# Patient Record
Sex: Male | Born: 1970 | Race: Asian | Hispanic: No | Marital: Married | Smoking: Former smoker
Health system: Southern US, Community
[De-identification: ages and names within clinical notes are randomized; demographics above are authoritative.]

## PROBLEM LIST (undated history)

## (undated) DIAGNOSIS — F411 Generalized anxiety disorder: Secondary | ICD-10-CM

## (undated) DIAGNOSIS — Z720 Tobacco use: Secondary | ICD-10-CM

## (undated) DIAGNOSIS — G44209 Tension-type headache, unspecified, not intractable: Secondary | ICD-10-CM

## (undated) HISTORY — DX: Tension-type headache, unspecified, not intractable: G44.209

## (undated) HISTORY — DX: Generalized anxiety disorder: F41.1

## (undated) HISTORY — DX: Tobacco use: Z72.0

---

## 2015-03-27 ENCOUNTER — Ambulatory Visit (INDEPENDENT_AMBULATORY_CARE_PROVIDER_SITE_OTHER): Payer: BLUE CROSS/BLUE SHIELD | Admitting: Osteopathic Medicine

## 2015-03-27 ENCOUNTER — Encounter: Payer: Self-pay | Admitting: Osteopathic Medicine

## 2015-03-27 ENCOUNTER — Other Ambulatory Visit: Payer: Self-pay | Admitting: Osteopathic Medicine

## 2015-03-27 VITALS — BP 107/69 | HR 71 | Ht 65.0 in | Wt 140.0 lb

## 2015-03-27 DIAGNOSIS — Z720 Tobacco use: Secondary | ICD-10-CM

## 2015-03-27 DIAGNOSIS — G44209 Tension-type headache, unspecified, not intractable: Secondary | ICD-10-CM | POA: Diagnosis not present

## 2015-03-27 DIAGNOSIS — F411 Generalized anxiety disorder: Secondary | ICD-10-CM | POA: Insufficient documentation

## 2015-03-27 DIAGNOSIS — E785 Hyperlipidemia, unspecified: Secondary | ICD-10-CM | POA: Diagnosis not present

## 2015-03-27 DIAGNOSIS — M9901 Segmental and somatic dysfunction of cervical region: Secondary | ICD-10-CM | POA: Diagnosis not present

## 2015-03-27 HISTORY — DX: Generalized anxiety disorder: F41.1

## 2015-03-27 HISTORY — DX: Tension-type headache, unspecified, not intractable: G44.209

## 2015-03-27 HISTORY — DX: Tobacco use: Z72.0

## 2015-03-27 MED ORDER — BUSPIRONE HCL 15 MG PO TABS
7.5000 mg | ORAL_TABLET | Freq: Two times a day (BID) | ORAL | Status: DC
Start: 2015-03-27 — End: 2015-04-10

## 2015-03-27 MED ORDER — PREGABALIN 50 MG PO CAPS: 50.0000 mg | ORAL_CAPSULE | Freq: Two times a day (BID) | ORAL | Status: DC

## 2015-03-27 NOTE — Progress Notes (Signed)
HPI: Joshua Bernard is a 45 y.o. male who [facility] [place] today for chief complaint of:  Chief Complaint  Patient presents with  . Establish Care    "general checkup, body aches (headaches), high anxiety"     SMOKING - on Chantix, has been on this for about 3-4 months. Was smoking a pack a day now 2 -3 cigarette.   HEADACHES/MUSCLE TENSION - Flexeril and Robaxin were used in the past. Interested in Lyrica.   DEPRESSION/ANXIETY - Paxil in the past  Generalized Anxiety D/O Evaluation:  Excessive anxiety/worry 6 mos+: Yes  Home and work/school: Yes  Difficulty to control: Yes  Associated symptoms (3/adult, 1/child):      Restlessness/on edge Yes      Fatigue Yes      Concentration Yes      Irritable Yes      Muscle tension Yes      Sleep disturbance Yes  Imparled social/occupational functioning: Yes  Any secondary cause or other concerns?   Major depression No concern  Personality d/o No concern  Social anxiety d/o No concern  Obsessive Compulsive d/o No concern  PTSD No concern  Eating d/o or Body Dysmorphic d/o No concern - stops eating when really stressed    Past medical, social and family history reviewed: Past Medical History  Diagnosis Date  . Generalized anxiety disorder 03/27/2015  . 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 Advised may require PA. Ergonomics in the workplace, generalized anxiety could certainly be contributing to these symptoms.   . Tobacco abuse 03/27/2015  . Generalized anxiety disorder 03/27/2015    Unable to tolerate SSRI, he Spiriva and initiated 03/27/2015, would consider Wellbutrin    History reviewed. No pertinent past surgical history. Social History  Substance Use Topics  . Smoking status: Not on file  . Smokeless tobacco: Not on file  . Alcohol Use: Not on file   No family history on file.  No  current outpatient prescriptions on file.   No current facility-administered medications for this visit.   No Known Allergies    Review of Systems: CONSTITUTIONAL:  No  fever, no chills, No  unintentional weight changes HEAD/EYES/EARS/NOSE/THROAT: (+) headache, no vision change, no hearing change, No  sore throat, No  sinus pressure CARDIAC: No  chest pain, No  pressure, No palpitations, No  orthopnea RESPIRATORY: No  cough, No  shortness of breath/wheeze GASTROINTESTINAL: No  nausea, No  vomiting, No  abdominal pain, No  blood in stool, No  diarrhea, No  constipation  MUSCULOSKELETAL: (+) myalgia/arthralgia GENITOURINARY: No  incontinence, No  abnormal genital bleeding/discharge SKIN: No  rash/wounds/concerning lesions HEM/ONC: No  easy bruising/bleeding, No  abnormal lymph node ENDOCRINE: No polyuria/polydipsia/polyphagia, No  heat/cold intolerance  NEUROLOGIC: No  weakness, No  dizziness, No  slurred speech PSYCHIATRIC: No  concerns with depression, (+) concerns with anxiety, No sleep problems  Exam:  BP 107/69 mmHg  Pulse 71  Ht 5\' 5"  (1.651 m)  Wt 140 lb (63.504 kg)  BMI 23.30 kg/m2 Constitutional: VS see above. General Appearance: alert, well-developed, well-nourished, NAD Eyes: Normal lids and conjunctive, non-icteric sclera,  Ears, Nose, Mouth, Throat: MMM, Normal external inspection ears/nares/mouth/lips/gums,  Neck: No masses, trachea midline. No thyroid enlargement/tenderness/mass appreciated. No lymphadenopathy Respiratory: Normal respiratory effort. no wheeze, no rhonchi, no rales Cardiovascular: S1/S2 normal, no murmur, no rub/gallop auscultated. RRR. No lower extremity edema. Gastrointestinal: Nontender,  no masses. Musculoskeletal: Gait normal. No clubbing/cyanosis of digits. (+) muscle tension/spasm b/l c-spine and upper thoracics, b/l restricted ROM, no midling tenderness Neurological: No cranial nerve deficit on limited exam. Motor and sensation intact and  symmetric Skin: warm, dry, intact. No rash/ulcer. No concerning nevi or subq nodules on limited exam.   Psychiatric: Normal judgment/insight. Hyper mood and affect. Oriented x3.     ASSESSMENT/PLAN: see patient instructions - can consider increase dose on Buspar and Lyrica if tolerates, consider Wellbutrin if needs daily medication since he didn't tolerate SSRI. Med switch or additions will require appt, unless trial other treatments in process of prior auth for Lyrica.   Generalized anxiety disorder - Plan: busPIRone (BUSPAR) 15 MG tablet  Muscle tension headache - Plan: pregabalin (LYRICA) 50 MG capsule  Somatic dysfunction of cervical region  Hyperlipidemia  Tobacco abuse   Procedure: OMT - MFR and ME to cervical spine/OA and upper thoracics to (+) patient relief  Return in about 1 year (around 03/26/2016), or sooner if needed, for [facility] VISIT AND MEDICATION MANAGEMENT.

## 2015-03-27 NOTE — Addendum Note (Signed)
Addended by: Deirdre PippinsALEXANDER, Janett Kamath M on: 03/27/2015 02:51 PM   Modules accepted: Level of Service

## 2015-03-27 NOTE — Patient Instructions (Addendum)
ANXIETY - we are starting an antianxiety medication called buspirone. Start at one half tablet 2 times daily, after 2-3 days can increase to 1 tablet 2 times daily. please let Dr. Lyn HollingsheadAlexander know if your symptoms are not controlled on the 1 tablet 2 times daily, because we can go up on the dose a bit further by you should talk with a physician before you do this on your own.  MUSCLE PAIN - pain and muscle tension/headaches should improve with treatment of anxiety, we have written for Lyrica prescription as requested, starting at a low-dose to see how this controls her symptoms, we can discuss increasing this medicine if you are tolerating the low dose and if your insurance covers it. Please be advised that your insurance may require that you try other medications before they will cover this medicine for you.   SMOKING - great job cutting back on the smoking! If you need refills on Chantix or if he like to talk more about adding the medication that we discussed at your visit, Wellbutrin, which helps with anxiety and can also help quitting smoking, we should talk about this more.   Anything else we can do for your, please call the office and let us know.  If you're doing well and just need medication refills, please have your pharmacy contact the office for refill requests. Please be sure to follow up at least annually for routine physical exam/wellness visit.    TAKE CARE! -DR. A.

## 2015-03-28 ENCOUNTER — Telehealth: Payer: Self-pay | Admitting: *Deleted

## 2015-03-28 DIAGNOSIS — F411 Generalized anxiety disorder: Secondary | ICD-10-CM

## 2015-03-28 NOTE — Telephone Encounter (Signed)
Pt's wife called and states the Buspar is not working for the patient and it made him very drowsy. She states he is still having very bad anxiety

## 2015-03-29 MED ORDER — PROPRANOLOL HCL 20 MG PO TABS: 10.0000 mg | ORAL_TABLET | Freq: Three times a day (TID) | ORAL | Status: DC | PRN

## 2015-03-29 NOTE — Telephone Encounter (Signed)
Patient has been informed and is fully aware to call the office and schedule appointment if the medication is not working also patient understands that he needs to bring a written list of all the medications he has taken in the past. Joshua Bernard,CMA.

## 2015-03-29 NOTE — Telephone Encounter (Signed)
Patient was advised at his visit that this medication, and os types of medication for anxiety, may cause some sedation effects. Sounds like he has been on hydroxyzine in the past which caused similar problems.   We'll trial propranolol, I have sent this in.   Patient states she did not want to be on daily therapy or controlled substances, however if this propranolol prescription is not helping him, he needs to come back for an appointment to talk more about daily medications as this may be his only option to avoid the sedating side effects that he does not tolereate. If he does need to come back in, he needs to bring a written list of all medications he has been on in the past for this issue.

## 2015-04-10 ENCOUNTER — Ambulatory Visit (INDEPENDENT_AMBULATORY_CARE_PROVIDER_SITE_OTHER): Payer: BLUE CROSS/BLUE SHIELD | Admitting: Osteopathic Medicine

## 2015-04-10 ENCOUNTER — Encounter: Payer: Self-pay | Admitting: Osteopathic Medicine

## 2015-04-10 VITALS — BP 110/77 | HR 73 | Ht 65.0 in | Wt 144.0 lb

## 2015-04-10 DIAGNOSIS — G44209 Tension-type headache, unspecified, not intractable: Secondary | ICD-10-CM | POA: Diagnosis not present

## 2015-04-10 DIAGNOSIS — F411 Generalized anxiety disorder: Secondary | ICD-10-CM | POA: Diagnosis not present

## 2015-04-10 MED ORDER — PREGABALIN 100 MG PO CAPS: 100.0000 mg | ORAL_CAPSULE | Freq: Two times a day (BID) | ORAL | Status: DC

## 2015-04-10 MED ORDER — BUPROPION HCL ER (XL) 150 MG PO TB24: 150.0000 mg | ORAL_TABLET | Freq: Every day | ORAL | Status: DC

## 2015-04-10 NOTE — Progress Notes (Signed)
HPI: Joshua Bernard is a 45 y.o. male who presents to [facility] today for chief complaint of:  Chief Complaint  Patient presents with  . Anxiety     Anxiety: Patient recently seen in the office and evaluated for anxiety, diagnosed with generalized anxiety disorder. Patient was declining daily SSRI therapy for prevention, requested something to use as needed. He had been on hydroxyzine in the past, so we tried buspirone. Patient reports side effects of drowsiness on this medication and is here to discuss further today.   Muscle tension headache: Patient doing well on Lyrica, his increased the dose after not feeling much effect of the 50 mg,   Past medical, social and family history reviewed: Past Medical History  Diagnosis Date  . Generalized anxiety disorder 03/27/2015  . 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 Advised may require PA. Ergonomics in the workplace, generalized anxiety could certainly be contributing to these symptoms.   . Tobacco abuse 03/27/2015  . Generalized anxiety disorder 03/27/2015    Unable to tolerate SSRI, he Spiriva and initiated 03/27/2015, would consider Wellbutrin    No past surgical history on file. Social History  Substance Use Topics  . Smoking status: Former Games developermoker  . Smokeless tobacco: Not on file  . Alcohol Use: Not on file   No family history on file.  Current Outpatient Prescriptions  Medication Sig Dispense Refill  . atorvastatin (LIPITOR) 80 MG tablet   5  . busPIRone (BUSPAR) 15 MG tablet Take 0.5 tablets (7.5 mg total) by mouth 2 (two) times daily. After 3 days, can increase to 1 tablet (15 mg total) 2 (two) times daily. 60 tablet 1  . CHANTIX 1 MG tablet Take 1 mg by mouth.  1  . pregabalin (LYRICA) 50 MG capsule Take 1 capsule (50 mg total) by mouth 2 (two) times daily. 30 capsule 0  . propranolol (INDERAL)  20 MG tablet Take 0.5-1 tablets (10-20 mg total) by mouth 3 (three) times daily as needed (anxiety). 30 tablet 1   No current facility-administered medications for this visit.   No Known Allergies    Review of Systems: CONSTITUTIONAL:  No  fever, no chills, No  unintentional weight changes CARDIAC: No  chest pain, No  pressure, No palpitations, No  orthopnea RESPIRATORY: No  cough, No  shortness of breath/wheeze NEUROLOGIC: No  weakness, No  dizziness, No  slurred speech PSYCHIATRIC: No  concerns with depression, (+) concerns with anxiety, No sleep problems  Exam:  BP 110/77 mmHg  Pulse 73  Ht 5\' 5"  (1.651 m)  Wt 144 lb (65.318 kg)  BMI 23.96 kg/m2 Constitutional: VS see above. General Appearance: alert, well-developed, well-nourished, NAD Psychiatric: Normal judgment/insight. Normal mood and affect. Oriented x3.    No results found for this or any previous visit (from the past 72 hour(s)).   GAD 7 : Generalized Anxiety Score 04/10/2015  Nervous, Anxious, on Edge 1  Control/stop worrying 1  Worry too much - different things 1  Trouble relaxing 1  Restless 0  Easily annoyed or irritable 1  Afraid - awful might happen 0  Total GAD 7 Score 5  Anxiety Difficulty Somewhat difficult       ASSESSMENT/PLAN: We'll trial will be changed given smoking history and concentration problems. The other SSRI may be more helpful for anxiety, would like to avoid side effects of possible erectile issues since he and  wife are trying to conceive. If doing well and minimal side effects but inadequate symptom control, could consider going up to the 300 mg tablets.  Generalized anxiety disorder - Plan: buPROPion (WELLBUTRIN XL) 150 MG 24 hr tablet, DISCONTINUED: buPROPion (WELLBUTRIN XL) 150 MG 24 hr tablet  Muscle tension headache - Plan: pregabalin (LYRICA) 100 MG capsule     Return if symptoms worsen or fail to improve.

## 2015-04-30 ENCOUNTER — Other Ambulatory Visit: Payer: Self-pay | Admitting: *Deleted

## 2015-04-30 DIAGNOSIS — G44209 Tension-type headache, unspecified, not intractable: Secondary | ICD-10-CM

## 2015-04-30 MED ORDER — PREGABALIN 100 MG PO CAPS: 100.0000 mg | ORAL_CAPSULE | Freq: Two times a day (BID) | ORAL | Status: DC

## 2015-04-30 NOTE — Progress Notes (Unsigned)
The patient's wife called and states they needed a full 30 day supply of Lyrica sent into the pharmacy due to insurance purposes. I think # 30 may have been sent in instead of # 60. I called pharm and changed this but just wanted to make sure I was not overlooking anything and that the rx was to be indeed written for # 60 with a sig of take one by mouth twice daily

## 2015-04-30 NOTE — Progress Notes (Signed)
That's fine.  Thank you.

## 2015-05-01 ENCOUNTER — Telehealth: Payer: Self-pay

## 2015-05-01 DIAGNOSIS — G44209 Tension-type headache, unspecified, not intractable: Secondary | ICD-10-CM

## 2015-05-01 MED ORDER — PREGABALIN 100 MG PO CAPS: 100.0000 mg | ORAL_CAPSULE | Freq: Two times a day (BID) | ORAL | Status: DC

## 2015-05-01 NOTE — Telephone Encounter (Signed)
Patient request a Rx for 30 day supply of Lyrica 100 mg. # 60 1 refill was faxed today to [facility] pharmacy by Estelle Junehonda Cunningham,CMA

## 2015-05-01 NOTE — Telephone Encounter (Signed)
Left VM for wife advising Rx was approved and faxed.

## 2015-05-12 ENCOUNTER — Other Ambulatory Visit: Payer: Self-pay | Admitting: Osteopathic Medicine

## 2015-05-28 ENCOUNTER — Other Ambulatory Visit: Payer: Self-pay | Admitting: Osteopathic Medicine

## 2015-05-28 ENCOUNTER — Telehealth: Payer: Self-pay

## 2015-05-28 MED ORDER — PREGABALIN 100 MG PO CAPS: 100.0000 mg | ORAL_CAPSULE | Freq: Two times a day (BID) | ORAL | Status: DC

## 2015-05-28 NOTE — Telephone Encounter (Signed)
Patient requested refill for Lyrica 100 mg. Izaiyah Kleinman,CMA

## 2015-06-10 ENCOUNTER — Other Ambulatory Visit: Payer: Self-pay | Admitting: Osteopathic Medicine

## 2015-06-13 ENCOUNTER — Other Ambulatory Visit: Payer: Self-pay | Admitting: Osteopathic Medicine

## 2015-06-15 ENCOUNTER — Other Ambulatory Visit: Payer: Self-pay | Admitting: Osteopathic Medicine

## 2015-06-16 ENCOUNTER — Other Ambulatory Visit: Payer: Self-pay | Admitting: Family Medicine

## 2015-06-16 ENCOUNTER — Other Ambulatory Visit: Payer: Self-pay | Admitting: Osteopathic Medicine

## 2015-06-18 ENCOUNTER — Other Ambulatory Visit: Payer: Self-pay | Admitting: Osteopathic Medicine

## 2015-06-20 ENCOUNTER — Other Ambulatory Visit: Payer: Self-pay

## 2015-06-20 MED ORDER — PREGABALIN 100 MG PO CAPS: 100.0000 mg | ORAL_CAPSULE | Freq: Two times a day (BID) | ORAL | Status: DC

## 2015-07-24 ENCOUNTER — Other Ambulatory Visit: Payer: Self-pay | Admitting: Osteopathic Medicine

## 2015-07-27 ENCOUNTER — Encounter: Payer: Self-pay | Admitting: Osteopathic Medicine

## 2015-07-27 ENCOUNTER — Ambulatory Visit (INDEPENDENT_AMBULATORY_CARE_PROVIDER_SITE_OTHER): Payer: BLUE CROSS/BLUE SHIELD | Admitting: Osteopathic Medicine

## 2015-07-27 ENCOUNTER — Other Ambulatory Visit: Payer: Self-pay | Admitting: Osteopathic Medicine

## 2015-07-27 VITALS — BP 122/82 | HR 73 | Ht 65.0 in | Wt 145.0 lb

## 2015-07-27 DIAGNOSIS — L709 Acne, unspecified: Secondary | ICD-10-CM

## 2015-07-27 DIAGNOSIS — G44209 Tension-type headache, unspecified, not intractable: Secondary | ICD-10-CM

## 2015-07-27 DIAGNOSIS — R5383 Other fatigue: Secondary | ICD-10-CM

## 2015-07-27 DIAGNOSIS — E785 Hyperlipidemia, unspecified: Secondary | ICD-10-CM | POA: Diagnosis not present

## 2015-07-27 DIAGNOSIS — F411 Generalized anxiety disorder: Secondary | ICD-10-CM | POA: Diagnosis not present

## 2015-07-27 MED ORDER — AMITRIPTYLINE HCL 25 MG PO TABS: 25.0000 mg | ORAL_TABLET | Freq: Every day | ORAL | Status: DC

## 2015-07-27 MED ORDER — PREGABALIN 150 MG PO CAPS: 150.0000 mg | ORAL_CAPSULE | Freq: Two times a day (BID) | ORAL | Status: DC

## 2015-07-27 MED ORDER — TRETINOIN 0.1 % EX CREA: TOPICAL_CREAM | Freq: Every day | CUTANEOUS | Status: AC

## 2015-07-27 NOTE — Patient Instructions (Addendum)
Try tapering off the Wellbutrin, we will try Amitryptilene at night for anxiety and sleep / muscle tension problems.   Take Lyrica twice per day as directed on the prescription bottle.   If no better let me know and we will send referral to psychiatry for anxiety treatment.   Plan to get blood work done today - we will call you next week with results.

## 2015-07-27 NOTE — Progress Notes (Signed)
HPI: Joshua Bernard is a 45 y.o. Not Hispanic or Latino male  who [facility] today, 07/27/2015,  for chief complaint of:  Chief Complaint  Patient presents with  . Follow-up    anxiety     Anxiety: Doing a little bit worse in this regard. Patient thinks that the Wellbutrin has started to give him headaches, though it has helped with smoking cessation. The spirometry doesn't seem to be helping as much as it did. Patient requests switching medication to something else. Does not want to be on SSRI therapy due to concern for previous side effects. See below for brief review of records. Patient is experiencing some fatigue concerns and some difficulty sleeping on occasion.  Muscle tension: Patient has been taking the Lyrica 2 tablets in the morning rather than 1 tablet twice a day as directed, he notes that he is waking up with some muscle stiffness, he initially started taking 2 tablets daily because the twice a day dosing wasn't really helping him. Requests a higher dose of this medication because that the dose does seem to be working little bit better throughout the day. Patient was not understanding that the medication is 12 hour duration of effect and this is why it twice a day dosing.  Acne: Patient has been using wife's Retin-A, has been helping him greatly, requests his own prescription.  Cholesterol, other labs: We never got records from previous PCP, will go ahead and get blood work done today.   Records reviewed: Patient was last seen in the office 04/10/2015, at that time we initiated Wellbutrin for anxiety, given side effects from anxiolytics buspirone which was initially started on 03/27/2015. She had some concern for SSRI side effects - had been on SSRI in the past and experienced erectile/libido issues.. Also using Lyrica for muscle tension    Past medical, surgical, social and family history reviewed: Past Medical History   Diagnosis Date  . Generalized anxiety disorder 03/27/2015  . 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 Advised may require PA. Ergonomics in the workplace, generalized anxiety could certainly be contributing to these symptoms.   . Tobacco abuse 03/27/2015  . Generalized anxiety disorder 03/27/2015    Unable to tolerate SSRI, he Spiriva and initiated 03/27/2015, would consider Wellbutrin    No past surgical history on file. Social History  Substance Use Topics  . Smoking status: Former Games developermoker  . Smokeless tobacco: Not on file  . Alcohol Use: Not on file   No family history on file.   Current medication list and allergy/intolerance information reviewed:   Current Outpatient Prescriptions  Medication Sig Dispense Refill  . atorvastatin (LIPITOR) 80 MG tablet   5  . buPROPion (WELLBUTRIN XL) 150 MG 24 hr tablet TAKE 1 TABLET(150 MG) BY MOUTH DAILY 90 tablet 0  . buPROPion (WELLBUTRIN XL) 150 MG 24 hr tablet TAKE 1 TABLET(150 MG) BY MOUTH DAILY 90 tablet 0  . LYRICA 100 MG capsule TAKE ONE CAPSULE BY MOUTH TWICE DAILY 60 capsule 0   No current facility-administered medications for this visit.   No Known Allergies    Review of Systems:  Constitutional:  No  fever, no chills, No recent illness, No unintentional weight changes. (+) significant fatigue.   HEENT: (+) headache, no vision change, no hearing change, No sore throat, No  sinus pressure  Cardiac: No  chest pain, No  pressure  Respiratory:  No  shortness of breath.   Musculoskeletal: No new myalgia/arthralgia  Skin: (+) Acne as per history of present illness No  Rash, No other wounds/concerning lesions  Psychiatric: No  concerns with depression, (+) concerns with anxiety, (+) sleep problems, No mood problems  Exam:  BP 122/82 mmHg  Pulse 73  Ht  (1.651 m)  Wt 145 lb (65.772 kg)  BMI 24.13 kg/m2  Constitutional: VS  see above. General Appearance: alert, well-developed, well-nourished, NAD  Ears, Nose, Mouth, Throat: MMM, Normal external inspection ears/nares/mouth/lips/gums  Neck: No masses, trachea midline.   Respiratory: Normal respiratory effort.  Neurological: Normal balance/coordination. No tremor.   Skin: warm, dry, intact. No rash/ulcer. No concerning nevi or subq nodules on limited exam.    Psychiatric: Fair to normal judgment/insight. Normal mood and affect. Oriented x3. GAD7 score increased.   GAD 7 : Generalized Anxiety Score 07/27/2015 04/10/2015  Nervous, Anxious, on Edge 2 1  Control/stop worrying 3 1  Worry too much - different things 3 1  Trouble relaxing 1 1  Restless 0 0  Easily annoyed or irritable 0 1  Afraid - awful might happen 0 0  Total GAD 7 Score 9 5  Anxiety Difficulty Somewhat difficult Somewhat difficult       ASSESSMENT/PLAN:   Given persistent muscle aches complaints, worse in neck area, consideration for possible fibromyalgia syndrome with comorbid generalized anxiety disorder. We'll update labs as well to evaluate for secondary cause since we never got previous records.  Medication adjustment as follows: Taper down on Wellbutrin, the patient states that he would rather just stop this altogether, I advised this can cause withdrawal symptoms but it is certainly an option. Initiate amitriptyline at low dose, may not be particularly helpful for generalized anxiety but hopefully will get some benefit in terms of chronic muscle pain. Slight increased dose on Lyrica and patient encouraged to use twice a day dosing as directed on the perception bottle.  If no improvement in symptoms, consider psychiatry referral for assistance with medication management since patient adamantly declines SSRI therapy, has noted some problems with taking Wellbutrin, buspirone has been somewhat effective but overall symptoms have persisted. Would also recommend counseling  Generalized  anxiety disorder - Patient would like to get off OB chin, declines SSRI. Given muscle tension issues, will trial Elavil, continue BuSpar. Refer to psych if no improvement - Plan: TSH, amitriptyline (ELAVIL) 25 MG tablet  Hyperlipidemia - Plan: Lipid panel  Other fatigue - Labs as below. Diet and exercise modifications encouraged - Plan: CBC with Differential/Platelet, COMPLETE METABOLIC PANEL WITH GFR, VITAMIN D 25 Hydroxy (Vit-D Deficiency, Fractures)  Muscle tension headache - Tension headache, neck spasm frequently. Lyrica has been helping a bit. Consider fibromyalgia diagnosis - Plan: pregabalin (LYRICA) 150 MG capsule, amitriptyline (ELAVIL) 25 MG tablet  Acne, unspecified acne type - Plan: tretinoin (RETIN-A) 0.1 % cream     Visit summary with medication list and pertinent instructions was printed for patient to review. All questions at time of visit were answered - patient instructed to contact office with any additional concerns. ER/RTC precautions were reviewed with the patient. Follow-up plan: Return in about 3 months (around 10/27/2015), or sooner if needed / if problems on labs, for MEDICATION MANAGEMENT .

## 2015-07-28 LAB — COMPLETE METABOLIC PANEL WITH GFR
ALBUMIN: 4.6 g/dL (ref 3.6–5.1)
ALK PHOS: 64 U/L (ref 40–115)
ALT: 24 U/L (ref 9–46)
AST: 22 U/L (ref 10–40)
BUN: 15 mg/dL (ref 7–25)
CALCIUM: 9.2 mg/dL (ref 8.6–10.3)
CHLORIDE: 106 mmol/L (ref 98–110)
CO2: 25 mmol/L (ref 20–31)
CREATININE: 1.03 mg/dL (ref 0.60–1.35)
GFR, Est Non African American: 89 mL/min (ref >=60)
Glucose, Bld: 85 mg/dL (ref 65–99)
Potassium: 4.1 mmol/L (ref 3.5–5.3)
Sodium: 140 mmol/L (ref 135–146)
Total Bilirubin: 0.7 mg/dL (ref 0.2–1.2)
Total Protein: 6.6 g/dL (ref 6.1–8.1)

## 2015-07-28 LAB — CBC WITH DIFFERENTIAL/PLATELET
BASOS ABS: 88 {cells}/uL (ref 0–200)
Basophils Relative: 1 %
EOS ABS: 264 {cells}/uL (ref 15–500)
EOS PCT: 3 %
HCT: 41.1 % (ref 38.5–50.0)
Hemoglobin: 13.9 g/dL (ref 13.2–17.1)
LYMPHS PCT: 37 %
Lymphs Abs: 3256 cells/uL (ref 850–3900)
MCH: 29.4 pg (ref 27.0–33.0)
MCHC: 33.8 g/dL (ref 32.0–36.0)
MCV: 86.9 fL (ref 80.0–100.0)
MONOS PCT: 8 %
MPV: 11.4 fL (ref 7.5–12.5)
Monocytes Absolute: 704 cells/uL (ref 200–950)
NEUTROS PCT: 51 %
Neutro Abs: 4488 cells/uL (ref 1500–7800)
PLATELETS: 205 10*3/uL (ref 140–400)
RBC: 4.73 MIL/uL (ref 4.20–5.80)
RDW: 13.7 % (ref 11.0–15.0)
WBC: 8.8 10*3/uL (ref 3.8–10.8)

## 2015-07-28 LAB — VITAMIN D 25 HYDROXY (VIT D DEFICIENCY, FRACTURES): VIT D 25 HYDROXY: 26 ng/mL — AB (ref 30–100)

## 2015-07-28 LAB — LIPID PANEL
CHOLESTEROL: 157 mg/dL (ref 125–200)
HDL: 39 mg/dL — ABNORMAL LOW (ref >=40)
LDL Cholesterol: 94 mg/dL (ref <130)
Total CHOL/HDL Ratio: 4 Ratio (ref <=5.0)
Triglycerides: 120 mg/dL (ref <150)
VLDL: 24 mg/dL (ref <30)

## 2015-07-28 LAB — TSH: TSH: 0.83 m[IU]/L (ref 0.40–4.50)

## 2015-08-02 ENCOUNTER — Other Ambulatory Visit: Payer: Self-pay

## 2015-08-02 DIAGNOSIS — E785 Hyperlipidemia, unspecified: Secondary | ICD-10-CM

## 2015-08-02 MED ORDER — ATORVASTATIN CALCIUM 80 MG PO TABS: 80.0000 mg | ORAL_TABLET | Freq: Every day | ORAL | Status: DC

## 2015-08-03 ENCOUNTER — Emergency Department (INDEPENDENT_AMBULATORY_CARE_PROVIDER_SITE_OTHER): Payer: BLUE CROSS/BLUE SHIELD

## 2015-08-03 ENCOUNTER — Emergency Department (INDEPENDENT_AMBULATORY_CARE_PROVIDER_SITE_OTHER)
Admission: EM | Admit: 2015-08-03 | Discharge: 2015-08-03 | Disposition: A | Discharge status: Home / Self Care | Payer: BLUE CROSS/BLUE SHIELD | Source: Home / Self Care | Attending: Family Medicine | Admitting: Family Medicine

## 2015-08-03 DIAGNOSIS — L03011 Cellulitis of right finger: Secondary | ICD-10-CM

## 2015-08-03 DIAGNOSIS — S61219A Laceration without foreign body of unspecified finger without damage to nail, initial encounter: Secondary | ICD-10-CM

## 2015-08-03 DIAGNOSIS — S61412A Laceration without foreign body of left hand, initial encounter: Secondary | ICD-10-CM | POA: Diagnosis not present

## 2015-08-03 DIAGNOSIS — M7989 Other specified soft tissue disorders: Secondary | ICD-10-CM

## 2015-08-03 DIAGNOSIS — M795 Residual foreign body in soft tissue: Secondary | ICD-10-CM

## 2015-08-03 MED ORDER — MUPIROCIN 2 % EX OINT: 1.0000 "application " | TOPICAL_OINTMENT | Freq: Three times a day (TID) | CUTANEOUS | Status: AC

## 2015-08-03 MED ORDER — CEPHALEXIN 500 MG PO CAPS: 500.0000 mg | ORAL_CAPSULE | Freq: Three times a day (TID) | ORAL | Status: DC

## 2015-08-03 NOTE — ED Notes (Signed)
Pt was cutting an onion on Monday, and pulled the band aid off today, and feels that it is not healing.  Skin is separated, but no signs of infection.

## 2015-08-03 NOTE — ED Provider Notes (Signed)
CSN: [id number]     Arrival date & time 08/03/15  1259 History   First MD Initiated Contact with Patient 08/03/15 1324     Chief Complaint  Patient presents with  . Extremity Laceration   (Consider location/radiation/quality/duration/timing/severity/associated sxs/prior Treatment)  Patient was cutting an onion with a knife 3 days ago, and lacerated the tip of his left second finger.  He pulled the band-aid off today, and feels that it is not healing.  He also believes that he may have a small retained foreign body in the volar surface of his right third finger. His Tdap is current.    The history is provided by the patient.  Laceration  Location:  Finger Finger laceration location:  L thumb and L index finger Length:  1 cm Depth:  Through dermis Quality: avulsion   Bleeding: controlled   Time since incident:  3 days Laceration mechanism:  Knife Pain details:    Quality:  Aching   Severity:  Mild   Timing:  Constant   Progression:  Improving Foreign body present:  Unable to specify Worsened by:  Movement Ineffective treatments: bandage. Tetanus status:  Up to date Associated symptoms: no numbness, no redness and no swelling     Past Medical History  Diagnosis Date  . Generalized anxiety disorder 03/27/2015  . 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 Advised may require PA. Ergonomics in the workplace, generalized anxiety could certainly be contributing to these symptoms.   . Tobacco abuse 03/27/2015  . Generalized anxiety disorder 03/27/2015    Unable to tolerate SSRI, he Spiriva and initiated 03/27/2015, would consider Wellbutrin    History reviewed. No pertinent past surgical history. History reviewed. No pertinent family history. Social History  Substance Use Topics  . Smoking status: Former Games developer  . Smokeless tobacco: None  . Alcohol Use: None    Review of Systems    All other systems reviewed and are negative.   Allergies  Review of patient's allergies indicates no known allergies.  Home Medications   Prior to Admission medications   Medication Sig Start Date End Date Taking? Authorizing Provider  amitriptyline (ELAVIL) 25 MG tablet Take 1 tablet (25 mg total) by mouth at bedtime. Can increase to 2 tablets per night after 2 weeks. 07/27/15   Sunnie Nielsen, DO  atorvastatin (LIPITOR) 80 MG tablet Take 1 tablet (80 mg total) by mouth daily at 6 PM. 08/02/15   Sunnie Nielsen, DO  buPROPion (WELLBUTRIN XL) 150 MG 24 hr tablet TAKE 1 TABLET(150 MG) BY MOUTH DAILY 06/18/15   Sunnie Nielsen, DO  cephALEXin (KEFLEX) 500 MG capsule Take 1 capsule (500 mg total) by mouth 3 (three) times daily. 08/03/15   Lattie Haw, MD  mupirocin ointment (BACTROBAN) 2 % Apply 1 application topically 3 (three) times daily. 08/03/15   Lattie Haw, MD  pregabalin (LYRICA) 150 MG capsule Take 1 capsule (150 mg total) by mouth 2 (two) times daily. 07/27/15   Sunnie Nielsen, DO  tretinoin (RETIN-A) 0.1 % cream Apply topically at bedtime. 07/27/15   Sunnie Nielsen, DO   Meds Ordered and Administered this Visit  Medications - No data to display  BP 115/77 mmHg  Pulse 77  Temp(Src) 98.8 F (37.1 C) (Oral)  Ht  (1.651 m)  Wt 143 lb 8 oz (65.091 kg)  BMI 23.88 kg/m2  SpO2 99% No data found.   Physical Exam  Constitutional:  He is oriented to person, place, and time. He appears well-developed and well-nourished. No distress.  HENT:  Head: Atraumatic.  Eyes: Pupils are equal, round, and reactive to light.  Musculoskeletal:       Hands: Patient's left second finger, distal phalanx, radial aspect has a superficial 1cm flap laceration that appears slightly erythematous and mildly tender to palpation.  No drainage present.  Fingertip has full range of motion.   His right third finger DIP joint, volar surface, has hyperkeratosis that could contain a small  foreign body; the area is slightly erythematous and tender to palpation   The DIP joint has full range of motion.  Neurological: He is alert and oriented to person, place, and time.  Skin: Skin is warm and dry.  Nursing note and vitals reviewed.   ED Course  Procedures Laceration Repair (Dermabond) left second finger distal phalanx. Discussed benefits and risks of procedure and verbal consent obtained. Using sterile technique, cleansed wound with Betadine followed by copious lavage with normal saline.  Wound carefully inspected for debris and foreign bodies; none found.  Wound edges carefully approximated in normal anatomic position and closed with Dermabond.  Wound precautions explained to patient.    Right third finger volar surface:  Cleansed DIP joint with Betadine and normal saline.  Without anesthesia, carefully debrided hyperkeratosis superficially.  No foreign body present.  Applied Bacitracin plus bandage.    Imaging Review Dg Finger Middle Right  08/03/2015  CLINICAL DATA:  Right middle finger laceration, evaluate for foreign body EXAM: RIGHT MIDDLE FINGER 2+V COMPARISON:  None available FINDINGS: Mild soft tissue swelling. No acute osseous finding or fracture. Normal alignment. No joint abnormality. No visualized radiopaque foreign body. IMPRESSION: Soft tissue swelling.  No acute osseous finding Electronically Signed   By: Judie PetitM.  Shick M.D.   On: 08/03/2015 14:16     Visual Acuity Review  Right Eye Distance:   Left Eye Distance:   Bilateral Distance:    Right Eye Near:   Left Eye Near:    Bilateral Near:           MDM   1. Laceration of finger of left hand with complication, initial encounter.    2. Examination for foreign body (FB) in soft tissue of middle finger, right  3. Cellulitis of middle finger, right    Sutures not indicated for small flap laceration left second finger.  However, would benefit from closure with Dermabond.  Suspect mild early cellulitis  present. No evidence of foreign body at DIP joint of right third finger. Applied stack splint to left second fingertip Begin Keflex 500mg  TID.  Apply Bactroban to right 3rd finger wound until healed. Change bandage on right middle finger daily until healed.  Follow instructions on Dermabond information sheet. Followup with Family Doctor in 4 days     Lattie HawStephen A Beese, MD 08/31/15 [phone]

## 2015-08-03 NOTE — Discharge Instructions (Signed)
Change bandage on right middle finger daily until healed.  Follow instructions on Dermabond information sheet.

## 2015-08-13 ENCOUNTER — Telehealth: Payer: Self-pay | Admitting: *Deleted

## 2015-08-13 NOTE — Telephone Encounter (Signed)
PA initiated for tretinoin cream   Response was [employer] does not review for product requested and forwarded to the appropriate are of [employer] for review

## 2015-09-06 NOTE — Telephone Encounter (Signed)
F/u on status of PA and [employer] did not have this in their system was advised to resubmit any progress notes and relevant info to [phone].   Called patient to see if they paid out of pocket for this before I initiated the PA, and pt states his wife had already called the insurance and got it taken care of.

## 2015-09-15 ENCOUNTER — Other Ambulatory Visit: Payer: Self-pay | Admitting: Osteopathic Medicine

## 2015-09-15 DIAGNOSIS — G44209 Tension-type headache, unspecified, not intractable: Secondary | ICD-10-CM

## 2015-09-18 ENCOUNTER — Other Ambulatory Visit: Payer: Self-pay | Admitting: Osteopathic Medicine

## 2015-09-18 DIAGNOSIS — G44209 Tension-type headache, unspecified, not intractable: Secondary | ICD-10-CM

## 2015-09-27 ENCOUNTER — Other Ambulatory Visit: Payer: Self-pay

## 2015-09-27 MED ORDER — BUPROPION HCL ER (XL) 150 MG PO TB24: ORAL_TABLET | ORAL | 0 refills | Status: DC

## 2015-09-27 NOTE — Telephone Encounter (Addendum)
Patient request refill for Buspirone 150 mg. #[phone] refills sent to pharmacy.Patient advised that follow up is needed for further refills. Adriann Ballweg,CMA

## 2015-10-05 ENCOUNTER — Telehealth: Payer: Self-pay

## 2015-10-05 DIAGNOSIS — F411 Generalized anxiety disorder: Secondary | ICD-10-CM

## 2015-10-05 MED ORDER — BUSPIRONE HCL 15 MG PO TABS: 15.0000 mg | ORAL_TABLET | Freq: Two times a day (BID) | ORAL | 2 refills | Status: DC

## 2015-10-05 NOTE — Telephone Encounter (Signed)
Joshua Bernard's wife called and states he needs a refill on Buspar. This medication was discontinued by provider. She states he takes it as needed. Please advise.

## 2015-10-05 NOTE — Telephone Encounter (Signed)
Medication sent. Wife advised.

## 2015-10-05 NOTE — Telephone Encounter (Signed)
OK to refill, this may have been D/C if he told us he wasn't taking it but based on my last note it's ok to keep on this.

## 2015-11-02 ENCOUNTER — Other Ambulatory Visit: Payer: Self-pay | Admitting: *Deleted

## 2015-11-20 ENCOUNTER — Other Ambulatory Visit: Payer: Self-pay | Admitting: Osteopathic Medicine

## 2015-12-18 ENCOUNTER — Other Ambulatory Visit: Payer: Self-pay | Admitting: Osteopathic Medicine

## 2015-12-18 DIAGNOSIS — F411 Generalized anxiety disorder: Secondary | ICD-10-CM

## 2015-12-21 ENCOUNTER — Other Ambulatory Visit: Payer: Self-pay | Admitting: Osteopathic Medicine

## 2015-12-25 ENCOUNTER — Other Ambulatory Visit: Payer: Self-pay

## 2015-12-25 MED ORDER — BUPROPION HCL ER (XL) 150 MG PO TB24: 150.0000 mg | ORAL_TABLET | Freq: Every day | ORAL | 0 refills | Status: AC

## 2015-12-28 ENCOUNTER — Ambulatory Visit: Payer: BLUE CROSS/BLUE SHIELD | Admitting: Osteopathic Medicine

## 2016-01-09 ENCOUNTER — Other Ambulatory Visit: Payer: Self-pay | Admitting: Osteopathic Medicine

## 2016-01-09 DIAGNOSIS — G44209 Tension-type headache, unspecified, not intractable: Secondary | ICD-10-CM

## 2016-01-17 ENCOUNTER — Encounter: Payer: Self-pay | Admitting: Osteopathic Medicine

## 2016-01-17 ENCOUNTER — Ambulatory Visit (INDEPENDENT_AMBULATORY_CARE_PROVIDER_SITE_OTHER): Payer: BLUE CROSS/BLUE SHIELD | Admitting: Osteopathic Medicine

## 2016-01-17 VITALS — BP 116/71 | HR 82 | Wt 150.0 lb

## 2016-01-17 DIAGNOSIS — M62838 Other muscle spasm: Secondary | ICD-10-CM

## 2016-01-17 DIAGNOSIS — B9789 Other viral agents as the cause of diseases classified elsewhere: Secondary | ICD-10-CM

## 2016-01-17 DIAGNOSIS — F411 Generalized anxiety disorder: Secondary | ICD-10-CM

## 2016-01-17 DIAGNOSIS — G44209 Tension-type headache, unspecified, not intractable: Secondary | ICD-10-CM

## 2016-01-17 DIAGNOSIS — L700 Acne vulgaris: Secondary | ICD-10-CM

## 2016-01-17 DIAGNOSIS — J069 Acute upper respiratory infection, unspecified: Secondary | ICD-10-CM

## 2016-01-17 DIAGNOSIS — E785 Hyperlipidemia, unspecified: Secondary | ICD-10-CM

## 2016-01-17 MED ORDER — BENZOYL PEROXIDE 5 % EX LIQD: Freq: Two times a day (BID) | CUTANEOUS | 12 refills | Status: AC

## 2016-01-17 MED ORDER — CLONAZEPAM 0.25 MG PO TBDP: ORAL_TABLET | ORAL | 0 refills | Status: DC

## 2016-01-17 MED ORDER — PREGABALIN 100 MG PO CAPS: 100.0000 mg | ORAL_CAPSULE | Freq: Two times a day (BID) | ORAL | 5 refills | Status: AC

## 2016-01-17 MED ORDER — ATORVASTATIN CALCIUM 80 MG PO TABS: 80.0000 mg | ORAL_TABLET | Freq: Every day | ORAL | 1 refills | Status: AC

## 2016-01-17 NOTE — Patient Instructions (Signed)
Note: the following list assumes no pregnancy, normal liver & kidney function and no other drug interactions. Dr. Ermalinda Joubert has highlighted medications which are safe for you to use, but these may not be appropriate for everyone. Always ask a pharmacist or qualified medical provider if there are any questions!    Aches/Pains, Fever Acetaminophen (Tylenol) 500 mg tablets - take max 2 tablets (1000 mg) every 6 hours (4 times per day)  Ibuprofen (Motrin) 200 mg tablets - take max 4 tablets (800 mg) every 6 hours  Sinus Congestion Prescription Atrovent Cromolyn Nasal Spray (NasalCrom) 1 spray each nostril 3-4 times per day, max 6 imes per day Nasal Saline if desired Oxymetolazone (Afrin, others) sparing use due to rebound congestion Phenylephrine (Sudafed) 10 mg tablets every 4 hours (or the 12-hour formulation) Diphenhydramine (Benadryl) 25 mg tablets - take max 2 tablets every 4 hours  Cough & Sore Throat Prescription cough pills or syrups Dextromethorphan (Robitussin, others) - cough suppressant Guaifenesin (Robitussin, Mucinex, others) - expectorant (helps cough up mucus) (Dextromethorphan and Guaifenesin also come in a combination tablet) Lozenges w/ Benzocaine + Menthol (Cepacol) Honey - as much as you want! Teas which "coat the throat" - look for ingredients Elm Bark, Licorice Root, Marshmallow Root  Other Zinc Lozenges within 24 hours of symptoms onset - mixed evidence this shortens the duration of the common cold Don't waste your money on Vitamin C or Echinacea    

## 2016-01-17 NOTE — Progress Notes (Signed)
HPI: Joshua Bernard is a 46 y.o. Not Hispanic or Latino male  who presents to [facility] today, 01/17/16,  for chief complaint of:  Chief Complaint  Patient presents with  . Follow-up    DISCUSS MEDICATION CHANGE     Patient is here today with wife, they will be moving to [place] soon and would like to get refills on medications prior to leaving at the end of the month. Would also like to discuss anxiety.   Anxiety: Had been through several attempts at treatment. Did not want to be on SSRI therapy due to concern for previous sexual side effects on these medications. Was on Wellbutrin which helped with smoking cessation but patient had concerns that it was making headaches were so he decided to stop this medication. Also previously on BuSpar but concerned that this was causing some sedation side effects. Has been resistant to the idea of a psychiatry referral or counseling. His wife reports that overall he has been doing okay with bouts of anxiety/irritability. He has been on BuSpar, hydroxyzine, propranolol in the past for non-benzodiazepine anxiolytics as needed but these were either not helpful or caused side effects, requests something to use as needed.  Muscle tension: Overall doing better on Lyrica twice a day as opposed to taking 2 tablets once daily, patient requests lowering the dose of this a bit as he be causing some sedation side effects.  Acne: Patient had been using wife's Retin-A, had been helping him greatly and he requested his own prescription however this was denied by insurance.  Viral upper respiratory illness: Has been feeling some sinus congestion and ear pressure for about a day. Occasional ear discomfort, wife has been putting unknown eardrops         Past medical, surgical, social and family history reviewed: Past Medical History:  Diagnosis Date  . Generalized anxiety disorder 03/27/2015  . Generalized anxiety disorder  03/27/2015   Unable to tolerate SSRI, he Spiriva and initiated 03/27/2015, would consider Wellbutrin   . 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 Advised may require PA. Ergonomics in the workplace, generalized anxiety could certainly be contributing to these symptoms.   . Tobacco abuse 03/27/2015   No past surgical history on file. Social History  Substance Use Topics  . Smoking status: Former Games developer  . Smokeless tobacco: Not on file  . Alcohol use Not on file   No family history on file.   Current medication list and allergy/intolerance information reviewed:   Current Outpatient Prescriptions  Medication Sig Dispense Refill  . atorvastatin (LIPITOR) 80 MG tablet TAKE ONE TABLET BY MOUTH ONCE DAILY AT 6 PM 30 tablet 2  . LYRICA 150 MG capsule TAKE ONE CAPSULE BY MOUTH TWICE DAILY 60 capsule 1  . mupirocin ointment (BACTROBAN) 2 % Apply 1 application topically 3 (three) times daily. 22 g 0  . tretinoin (RETIN-A) 0.1 % cream Apply topically at bedtime. 45 g 3  . amitriptyline (ELAVIL) 25 MG tablet Take 1 tablet (25 mg total) by mouth at bedtime. Can increase to 2 tablets per night after 2 weeks. (Patient not taking: Reported on 01/17/2016) 30 tablet 1  . buPROPion (WELLBUTRIN XL) 150 MG 24 hr tablet Take 1 tablet (150 mg total) by mouth daily. FOLLOW UP APPOINTMENT NEEDED FOR FURTHER REFILLS (Patient not taking: Reported on 01/17/2016) 5 tablet 0  . busPIRone (BUSPAR) 15 MG tablet Take 1  tablet (15 mg total) by mouth 2 (two) times daily. APPOINTMENT NEEDED FOR FURTHER REFILLS (Patient not taking: Reported on 01/17/2016) 60 tablet 0   No current facility-administered medications for this visit.    No Known Allergies    Review of Systems:  Constitutional:  No  fever, no chills, No recent illness, No unintentional weight changes. (+) significant fatigue.   HEENT: (+) headache muscle tension but  better w/ Lyrica, no vision change, no hearing change, No sore throat, No  sinus pressure  Cardiac: No  chest pain, No  pressure  Respiratory:  No  shortness of breath.   Musculoskeletal: No new myalgia/arthralgia  Skin: (+) Acne as per history of present illness No  Rash, No other wounds/concerning lesions  Psychiatric: No  concerns with depression, (+) concerns with anxiety, (+) sleep problems, No mood problems  Exam:  BP 116/71   Pulse 82   Wt 150 lb (68 kg)   BMI 24.96 kg/m   Constitutional: VS see above. General Appearance: alert, well-developed, well-nourished, NAD  Ears, Nose, Mouth, Throat: MMM, Normal external inspection ears/nares/mouth/lips/gums. TM normal bilaterally, external ear canal no erythema/edema but some excoriation type irritation  Neck: No masses, trachea midline.   Respiratory: Normal respiratory effort.  Neurological: Normal balance/coordination. No tremor.   Skin: warm, dry, intact. No rash/ulcer. No concerning nevi or subq nodules on limited exam.    Psychiatric: Fair to normal judgment/insight. Normal mood and affect. Oriented x3. GAD7 score increased.   GAD 7 : Generalized Anxiety Score 01/17/2016 07/27/2015 04/10/2015  Nervous, Anxious, on Edge 3 2 1   Control/stop worrying 0 3 1  Worry too much - different things 2 3 1   Trouble relaxing 2 1 1   Restless 0 0 0  Easily annoyed or irritable 1 0 1  Afraid - awful might happen 1 0 0  Total GAD 7 Score 9 9 5   Anxiety Difficulty - Somewhat difficult Somewhat difficult    Depression screen PHQ 2/9 01/17/2016  Decreased Interest 1  Down, Depressed, Hopeless 1  PHQ - 2 Score 2  Altered sleeping 1  Tired, decreased energy 2  Change in appetite 2  Feeling bad or failure about yourself  0  Trouble concentrating 1  Moving slowly or fidgety/restless 0  Suicidal thoughts 0  PHQ-9 Score 8       ASSESSMENT/PLAN:  Failure of non-benzodiazepine anxiolytics, will trial low-dose clonazepam with sparing  use, see prescription directions which were Actos about limited use of this medication. Patient has had significant side effects and other problems with daily medications using SSRI and Wellbutrin and BuSpar. Has been resistant to psychiatry referral but wife states that she plans to get him set up with a specialist once they moved to [place]. Given persistent muscle aches complaints, worse in neck area, consideration for possible fibromyalgia syndrome with comorbid generalized anxiety disorder.   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 All questions at time of visit were answered - patient instructed to contact office with any additional concerns. ER/RTC precautions were reviewed with the patient. Follow-up plan: Return for as needed-care . Will be happy to forward records to new PCP up [facility]

## 2016-02-08 ENCOUNTER — Ambulatory Visit (INDEPENDENT_AMBULATORY_CARE_PROVIDER_SITE_OTHER): Payer: BLUE CROSS/BLUE SHIELD | Admitting: Physician Assistant

## 2016-02-08 VITALS — BP 106/73 | HR 76 | Wt 151.0 lb

## 2016-02-08 DIAGNOSIS — H00011 Hordeolum externum right upper eyelid: Secondary | ICD-10-CM

## 2016-02-08 DIAGNOSIS — M62838 Other muscle spasm: Secondary | ICD-10-CM

## 2016-02-08 MED ORDER — CYCLOBENZAPRINE HCL 10 MG PO TABS: 10.0000 mg | ORAL_TABLET | Freq: Two times a day (BID) | ORAL | 0 refills | Status: AC | PRN

## 2016-02-08 NOTE — Patient Instructions (Signed)
Apply a clean, warm compress to your eye for 10 minutes, at least 4 times a day I recommend running a face cloth under warm water and microwaving it for about 30 seconds until warm, but not scalding You do not need to apply any topical creams to the eye Follow-up if no improvement in 1 week  Stye A stye is a bump on your eyelid caused by a bacterial infection. A stye can form inside the eyelid (internal stye) or outside the eyelid (external stye). An internal stye may be caused by an infected oil-producing gland inside your eyelid. An external stye may be caused by an infection at the base of your eyelash (hair follicle). Styes are very common. Anyone can get them at any age. They usually occur in just one eye, but you may have more than one in either eye. What are the causes? The infection is almost always caused by bacteria called Staphylococcus aureus. This is a common type of bacteria that lives on your skin. What increases the risk? You may be at higher risk for a stye if you have had one before. You may also be at higher risk if you have:  Diabetes.  Long-term illness.  Long-term eye redness.  A skin condition called seborrhea.  High fat levels in your blood (lipids). What are the signs or symptoms? Eyelid pain is the most common symptom of a stye. Internal styes are more painful than external styes. Other signs and symptoms may include:  Painful swelling of your eyelid.  A scratchy feeling in your eye.  Tearing and redness of your eye.  Pus draining from the stye. How is this diagnosed? Your health care provider may be able to diagnose a stye just by examining your eye. The health care provider may also check to make sure:  You do not have a fever or other signs of a more serious infection.  The infection has not spread to other parts of your eye or areas around your eye. How is this treated? Most styes will clear up in a few days without treatment. In some cases, you  may need to use antibiotic drops or ointment to prevent infection. Your health care provider may have to drain the stye surgically if your stye is:  Large.  Causing a lot of pain.  Interfering with your vision. This can be done using a thin blade or a needle. Follow these instructions at home:  Take medicines only as directed by your health care provider.  Apply a clean, warm compress to your eye for 10 minutes, 4 times a day.  Do not wear contact lenses or eye makeup until your stye has healed.  Do not try to pop or drain the stye. Contact a health care provider if:  You have chills or a fever.  Your stye does not go away after several days.  Your stye affects your vision.  Your eyeball becomes swollen, red, or painful. This information is not intended to replace advice given to you by your health care provider. Make sure you discuss any questions you have with your health care provider. Document Released: 10/09/2004 Document Revised: 08/26/2015 Document Reviewed: 04/15/2013 Elsevier Interactive Patient Education  2017 [employer]

## 2016-02-08 NOTE — Progress Notes (Signed)
HPI:                                                                Joshua Bernard is a 46 y.o. male who presents to [facility] today for   Right eyelid swelling x 3 days. Endorses eyelid tenderness and discomfort. No eye pain or redness. No vision change. No eye drainage. Has tried a petroleum jelly without much relief. No fevers, chills, or nasal congestion.  Patient does endorse chronic tension headaches. He requests a few days supply of muscle relaxer because they are moving in a few days.  Past Medical History:  Diagnosis Date  . Generalized anxiety disorder 03/27/2015  . Generalized anxiety disorder 03/27/2015   Unable to tolerate SSRI, he Spiriva and initiated 03/27/2015, would consider Wellbutrin   . 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 Advised may require PA. Ergonomics in the workplace, generalized anxiety could certainly be contributing to these symptoms.   . Tobacco abuse 03/27/2015   No past surgical history on file. Social History  Substance Use Topics  . Smoking status: Former Games developer  . Smokeless tobacco: Not on file  . Alcohol use Not on file   family history is not on file.  ROS: negative except as noted in the HPI  Medications: Current Outpatient Prescriptions  Medication Sig Dispense Refill  . atorvastatin (LIPITOR) 80 MG tablet Take 1 tablet (80 mg total) by mouth daily. 90 tablet 1  . benzoyl peroxide 5 % external liquid Apply topically 2 (two) times daily. 142 g 12  . buPROPion (WELLBUTRIN XL) 150 MG 24 hr tablet Take 1 tablet (150 mg total) by mouth daily. FOLLOW UP APPOINTMENT NEEDED FOR FURTHER REFILLS 5 tablet 0  . clonazepam (KLONOPIN) 0.25 MG disintegrating tablet 0.125-0.25 mg (1/2-1 tablet) maximum twice per day as needed for severe anxiety. Limit use to 1 - 2 times per week to avoid tolerance/dependence.  #30 for 90(ninety) days. 30 tablet 0  . mupirocin ointment (BACTROBAN) 2 % Apply 1 application topically 3 (three) times daily. 22 g 0  . pregabalin (LYRICA) 100 MG capsule Take 1 capsule (100 mg total) by mouth 2 (two) times daily. 60 capsule 5  . tretinoin (RETIN-A) 0.1 % cream Apply topically at bedtime. 45 g 3   No current facility-administered medications for this visit.    No Known Allergies     Objective:  BP 106/73   Pulse 76   Wt 151 lb (68.5 kg)   BMI 25.13 kg/m  Gen: well-groomed, cooperative, not ill-appearing, no distress Eye: conjunctiva pink, sclera white, right upper lid with mild edema - there is a 1mm erythematous papule to the left of the right tear duct, left lid normal Neuro: alert and oriented x 3, EOM's intact, PERRL  No results found for this or any previous visit (from the past 72 hour(s)). No results found.    Assessment and Plan: 46 y.o. male with   1. Hordeolum externum of right upper eyelid Apply a clean, warm compress to your eye for 10 minutes, at least 4 times a day  2. Neck muscle spasm - follow-up with PCP - cyclobenzaprine (FLEXERIL) 10 MG tablet; Take  1 tablet (10 mg total) by mouth 2 (two) times daily as needed for muscle spasms.  Dispense: 10 tablet; Refill: 0    Patient education and anticipatory guidance given Patient agrees with treatment plan Follow-up if no improvement in 1 week  Levonne Hubertharley E. Adalie Mand PA-C

## 2016-04-18 ENCOUNTER — Other Ambulatory Visit: Payer: Self-pay

## 2016-04-18 DIAGNOSIS — F411 Generalized anxiety disorder: Secondary | ICD-10-CM

## 2016-04-18 MED ORDER — CLONAZEPAM 0.25 MG PO TBDP: ORAL_TABLET | ORAL | 0 refills | Status: DC

## 2016-04-18 NOTE — Telephone Encounter (Signed)
pATIENT REQUEST REFILL FOR cLONZEPAM 0.2 MG. APTIENT ADVISED THAT APPOINTMENT IS NEEDED FOR FURTHER REFILLS. Shantasia Hunnell,CMA

## 2016-04-22 ENCOUNTER — Other Ambulatory Visit: Payer: Self-pay | Admitting: Osteopathic Medicine

## 2016-04-22 MED ORDER — CLONAZEPAM 0.5 MG PO TABS: 0.5000 mg | ORAL_TABLET | Freq: Two times a day (BID) | ORAL | 0 refills | Status: AC | PRN

## 2016-04-22 NOTE — Progress Notes (Signed)
Corrected Klonopin to not-ODT formulation. Sent new Rx to [facility] in [place], NO ADDITIONAL REFILLS FROM ME needs to establish pcp local to his new place of residence

## 2016-05-26 ENCOUNTER — Other Ambulatory Visit: Payer: Self-pay | Admitting: Osteopathic Medicine

## 2016-08-14 IMAGING — DX DG FINGER MIDDLE 2+V*R*
3 series · 3 of 3 positions shown · non-contrast
Comparison: None available

CLINICAL DATA: Right middle finger laceration, evaluate for foreign
body

EXAM:
RIGHT MIDDLE FINGER 2+V

[finger ap]
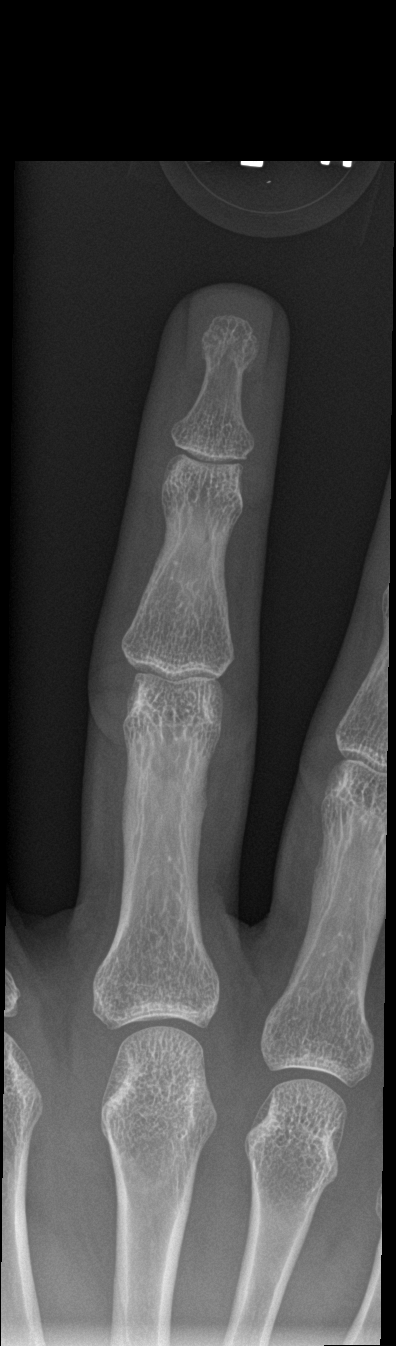

[finger obl]
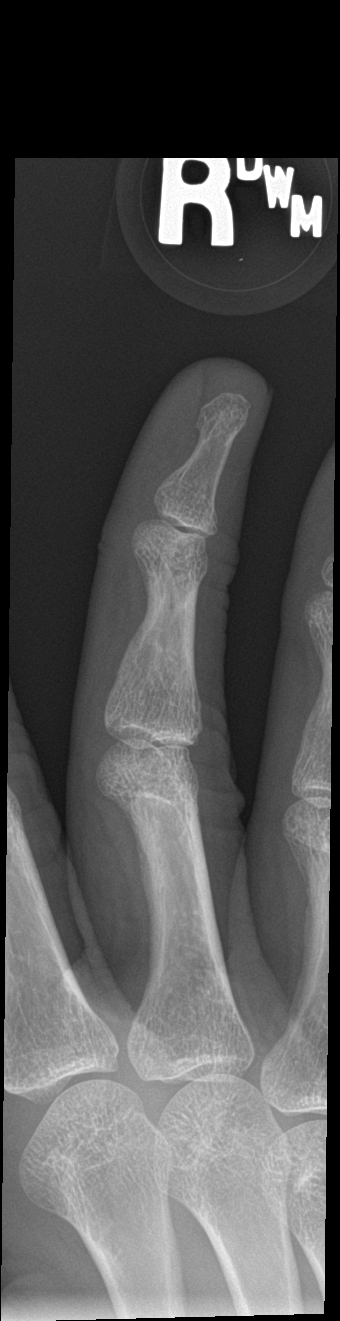

[finger lat]
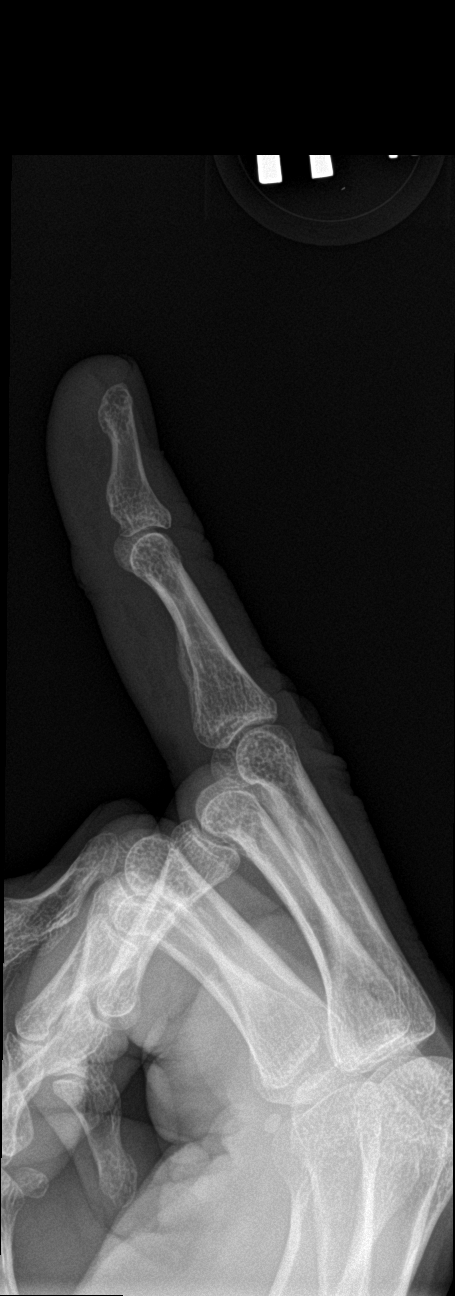

[3 of 3 positions shown; findings below may reference images not displayed]

FINDINGS: Mild soft tissue swelling. No acute osseous finding or fracture.
Normal alignment. No joint abnormality. No visualized radiopaque
foreign body.
IMPRESSION: Soft tissue swelling.  No acute osseous finding
